# Patient Record
Sex: Male | Born: 1995 | Race: Black or African American | Hispanic: No | Marital: Single | Smoking: Never smoker
Health system: Southern US, Community
[De-identification: ages and names within clinical notes are randomized; demographics above are authoritative.]

## PROBLEM LIST (undated history)

## (undated) DIAGNOSIS — R634 Abnormal weight loss: Secondary | ICD-10-CM

## (undated) DIAGNOSIS — R630 Anorexia: Secondary | ICD-10-CM

## (undated) DIAGNOSIS — F840 Autistic disorder: Secondary | ICD-10-CM

## (undated) DIAGNOSIS — R109 Unspecified abdominal pain: Secondary | ICD-10-CM

## (undated) HISTORY — PX: CIRCUMCISION: SUR203

## (undated) HISTORY — DX: Anorexia: R63.0

## (undated) HISTORY — DX: Unspecified abdominal pain: R10.9

## (undated) HISTORY — DX: Abnormal weight loss: R63.4

---
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
## 2019-03-15 ENCOUNTER — Other Ambulatory Visit: Payer: Self-pay | Admitting: Nurse Practitioner

## 2019-03-15 DIAGNOSIS — R569 Unspecified convulsions: Secondary | ICD-10-CM

## 2019-03-18 ENCOUNTER — Ambulatory Visit
Admission: RE | Admit: 2019-03-18 | Discharge: 2019-03-18 | Disposition: A | Discharge status: Home / Self Care | Payer: Medicaid Other | Source: Ambulatory Visit | Attending: Nurse Practitioner | Admitting: Nurse Practitioner

## 2019-03-18 DIAGNOSIS — R569 Unspecified convulsions: Secondary | ICD-10-CM

## 2019-10-29 IMAGING — CT CT HEAD W/O CM
1 series · 16 of 30 positions shown, 20 images · non-contrast
Comparison: December 05, 2014

CLINICAL DATA: Autism with seizure

EXAM:
CT HEAD WITHOUT CONTRAST
TECHNIQUE: Contiguous axial images were obtained from the base of the skull
through the vertex without intravenous contrast.

[Series 2: head w/(date) · axial · 0.49mm/px · z∈[-182,-22]mm · 16 of 36 slices shown, 20 images]
[im 2/36  brain]
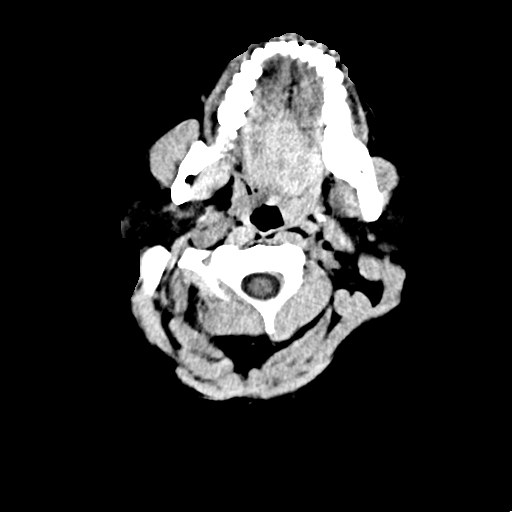
[im 2/36  bone]
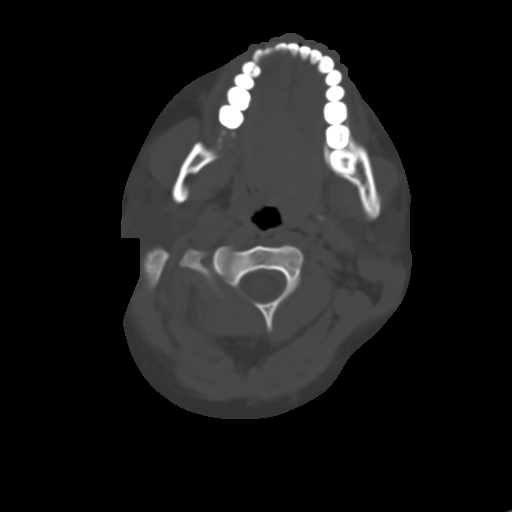
[im 4/36  brain]
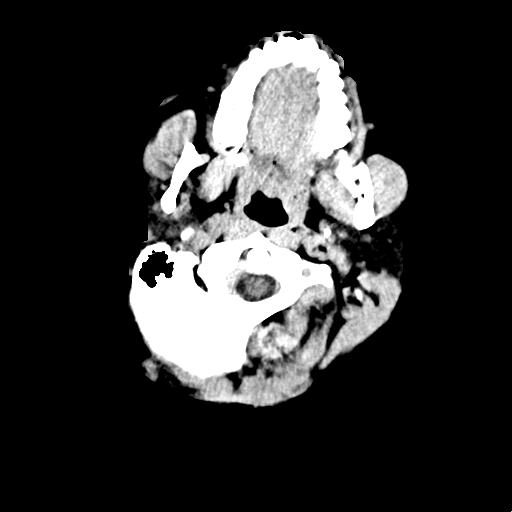
[im 7/36  brain]
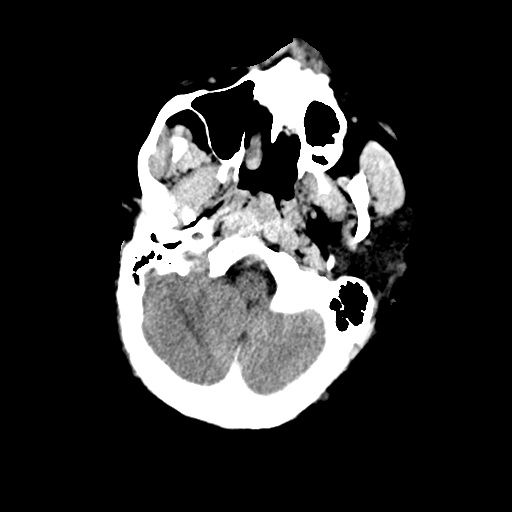
[im 9/36  brain]
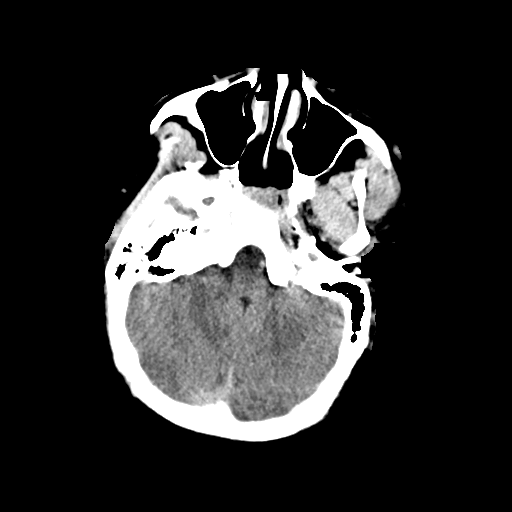
[im 10/36  brain]
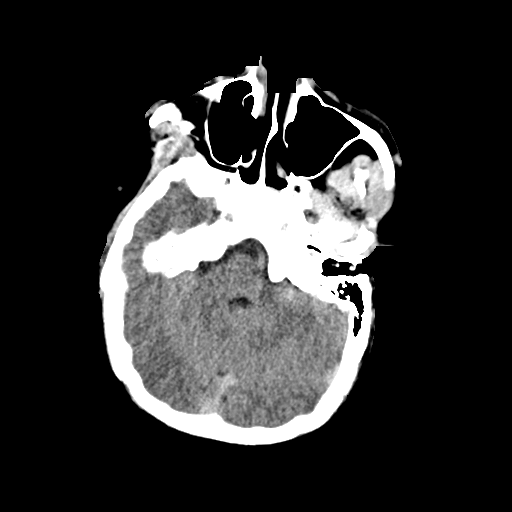
[im 10/36  bone]
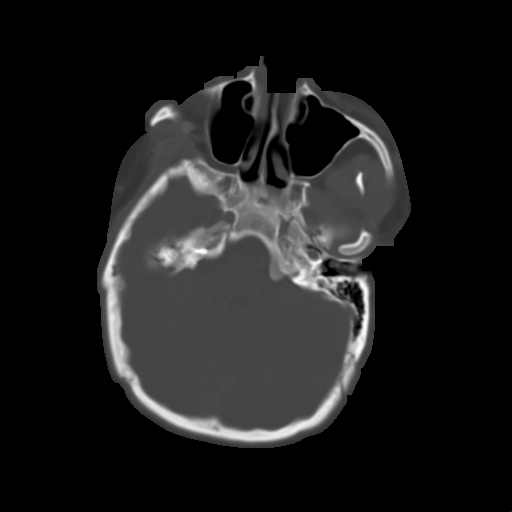
[im 13/36  brain]
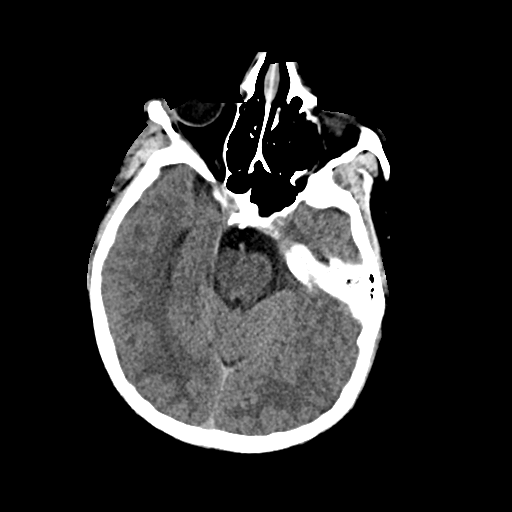
[im 15/36  brain]
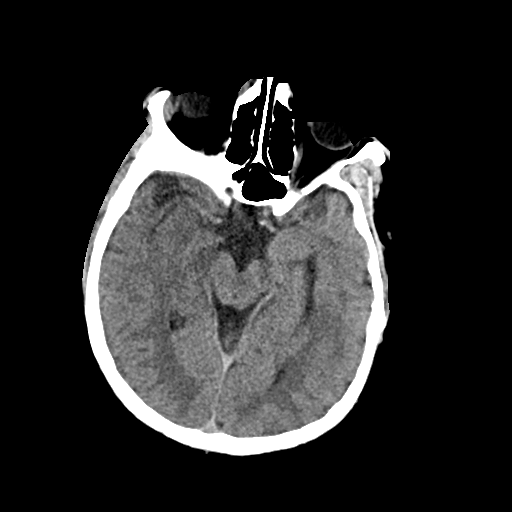
[im 17/36  brain]
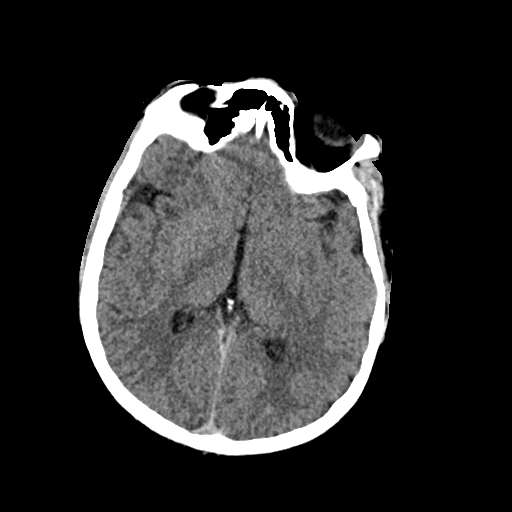
[im 19/36  brain]
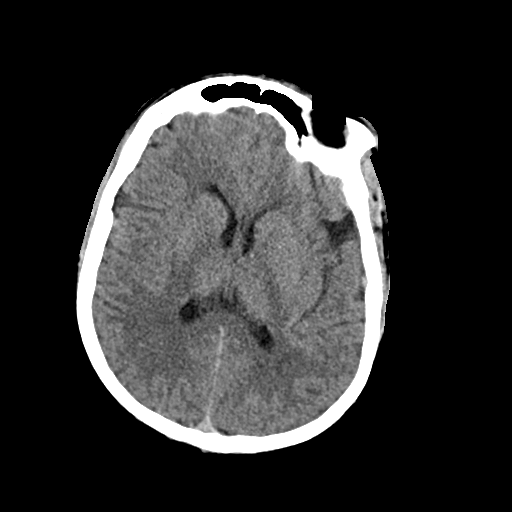
[im 19/36  bone]
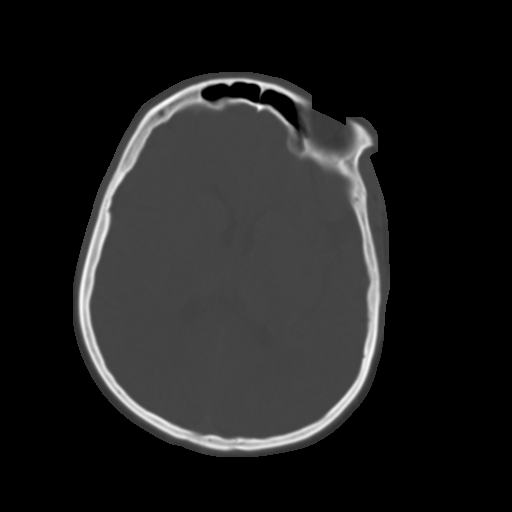
[im 21/36  brain]
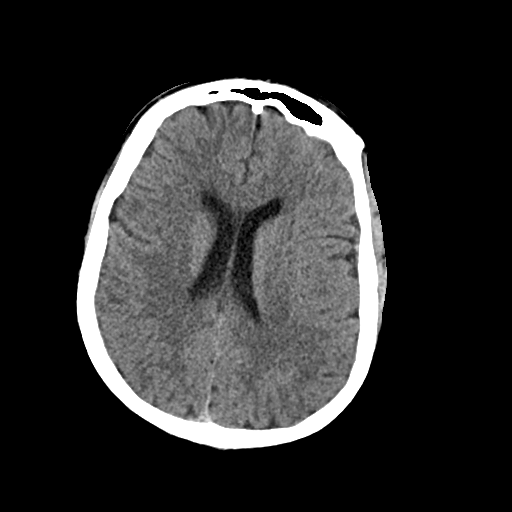
[im 23/36  brain]
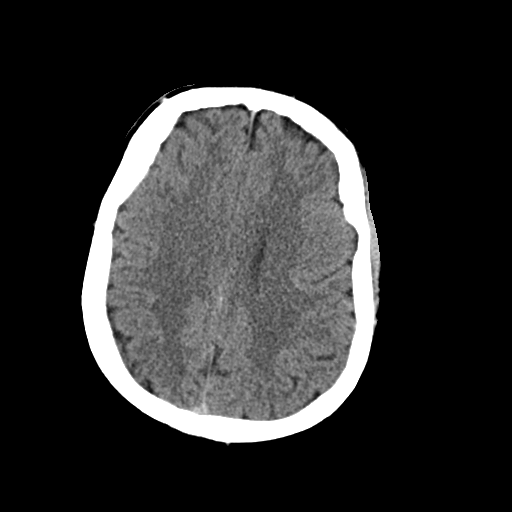
[im 26/36  brain]
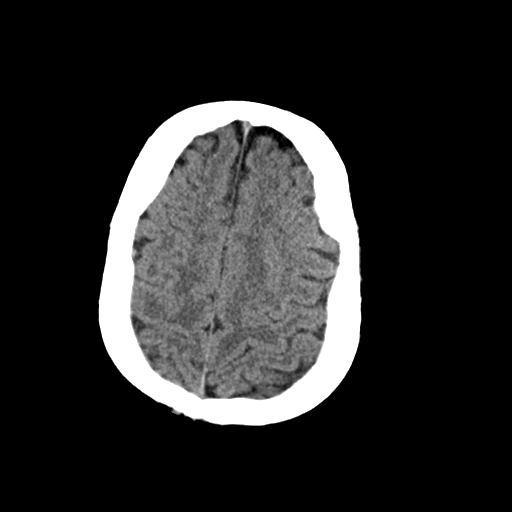
[im 27/36  brain]
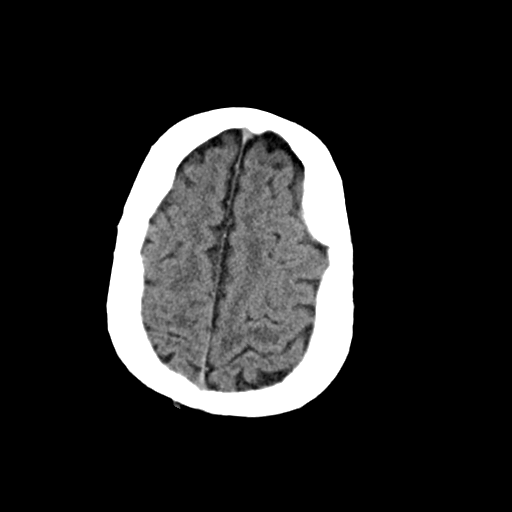
[im 27/36  bone]
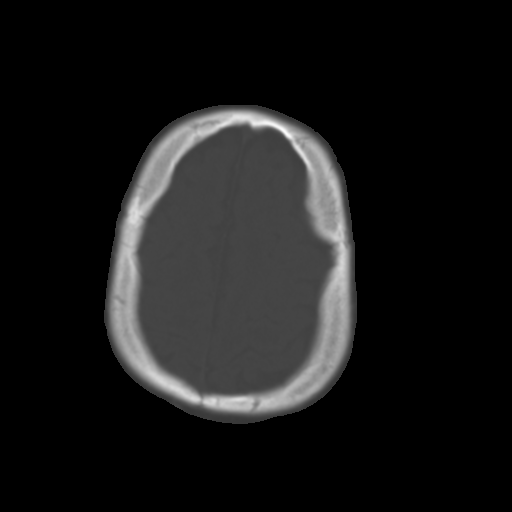
[im 29/36  brain]
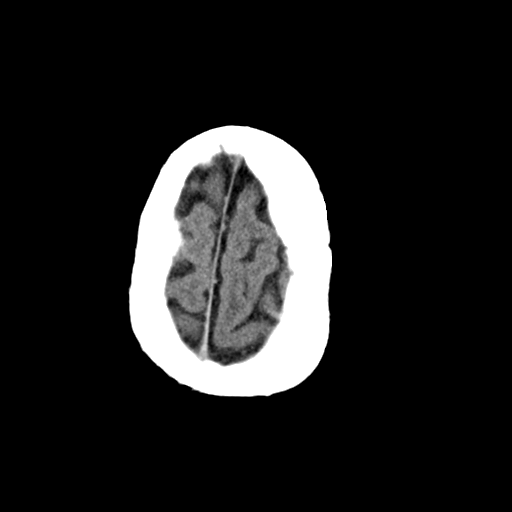
[im 32/36  brain]
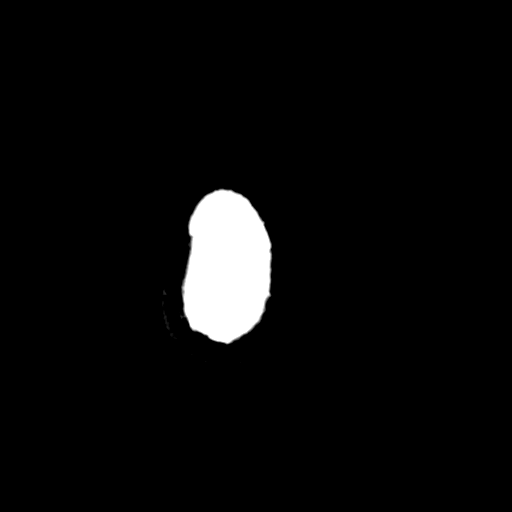
[im 34/36  brain]
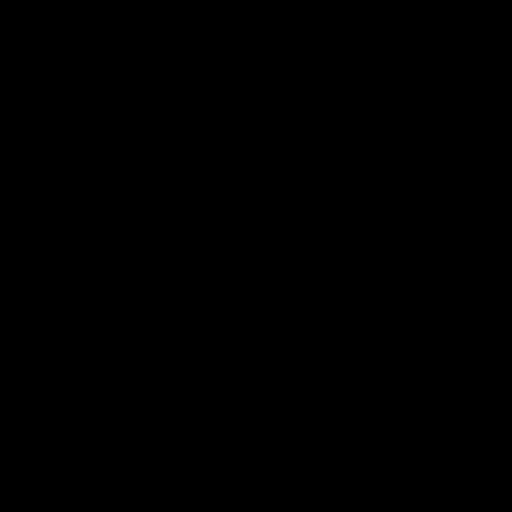

[16 of 30 positions shown; findings below may reference images not displayed]

FINDINGS: Brain: The ventricles are normal in size and configuration. There is
no intracranial mass, hemorrhage, extra-axial fluid collection, or
midline shift. The brain parenchyma appears unremarkable. There is
no demonstrable acute infarct.

Vascular: There is no hyperdense vessel. There is no evident
vascular calcification.

Skull: The bony calvarium appears intact.

Sinuses/Orbits: There is mucosal thickening in several ethmoid air
cells. There is opacification throughout portions of the right
sphenoid sinus. Orbits appear symmetric bilaterally.

Other: Mastoid air cells are clear. There is debris in each external
auditory canal.
IMPRESSION: Areas of paranasal sinus disease. Probable cerumen in each external
auditory canal. Brain parenchyma appears unremarkable. No mass or
hemorrhage.

## 2019-11-08 ENCOUNTER — Other Ambulatory Visit: Payer: Self-pay

## 2019-11-08 ENCOUNTER — Emergency Department (HOSPITAL_COMMUNITY)
Admission: EM | Admit: 2019-11-08 | Discharge: 2019-11-08 | Disposition: A | Discharge status: Home / Self Care | Payer: Medicaid Other | Attending: Emergency Medicine | Admitting: Emergency Medicine

## 2019-11-08 ENCOUNTER — Encounter (HOSPITAL_COMMUNITY): Payer: Self-pay | Admitting: Emergency Medicine

## 2019-11-08 DIAGNOSIS — Z79899 Other long term (current) drug therapy: Secondary | ICD-10-CM | POA: Insufficient documentation

## 2019-11-08 DIAGNOSIS — R569 Unspecified convulsions: Secondary | ICD-10-CM | POA: Insufficient documentation

## 2019-11-08 DIAGNOSIS — F840 Autistic disorder: Secondary | ICD-10-CM | POA: Insufficient documentation

## 2019-11-08 LAB — COMPREHENSIVE METABOLIC PANEL
ALT: 36 U/L (ref 0–44)
AST: 36 U/L (ref 15–41)
Albumin: 4.4 g/dL (ref 3.5–5.0)
Alkaline Phosphatase: 103 U/L (ref 38–126)
Anion gap: 11 (ref 5–15)
BUN: 13 mg/dL (ref 6–20)
CO2: 24 mmol/L (ref 22–32)
Calcium: 9.7 mg/dL (ref 8.9–10.3)
Chloride: 105 mmol/L (ref 98–111)
Creatinine, Ser: 0.94 mg/dL (ref 0.61–1.24)
GFR calc Af Amer: >60 mL/min (ref >60)
GFR calc non Af Amer: >60 mL/min (ref >60)
Glucose, Bld: 100 mg/dL — ABNORMAL HIGH (ref 70–99)
Potassium: 4.4 mmol/L (ref 3.5–5.1)
Sodium: 140 mmol/L (ref 135–145)
Total Bilirubin: 0.5 mg/dL (ref 0.3–1.2)
Total Protein: 7.7 g/dL (ref 6.5–8.1)

## 2019-11-08 LAB — RAPID URINE DRUG SCREEN, HOSP PERFORMED
Amphetamines: NOT DETECTED
Barbiturates: NOT DETECTED
Benzodiazepines: NOT DETECTED
Cocaine: NOT DETECTED
Opiates: NOT DETECTED
Tetrahydrocannabinol: NOT DETECTED

## 2019-11-08 LAB — CBC WITH DIFFERENTIAL/PLATELET
Abs Immature Granulocytes: 0.05 10*3/uL (ref 0.00–0.07)
Basophils Absolute: 0 10*3/uL (ref 0.0–0.1)
Basophils Relative: 0 %
Eosinophils Absolute: 0 10*3/uL (ref 0.0–0.5)
Eosinophils Relative: 0 %
HCT: 49.3 % (ref 39.0–52.0)
Hemoglobin: 16.4 g/dL (ref 13.0–17.0)
Immature Granulocytes: 0 %
Lymphocytes Relative: 11 %
Lymphs Abs: 1.5 10*3/uL (ref 0.7–4.0)
MCH: 26.8 pg (ref 26.0–34.0)
MCHC: 33.3 g/dL (ref 30.0–36.0)
MCV: 80.6 fL (ref 80.0–100.0)
Monocytes Absolute: 0.9 10*3/uL (ref 0.1–1.0)
Monocytes Relative: 6 %
Neutro Abs: 11.4 10*3/uL — ABNORMAL HIGH (ref 1.7–7.7)
Neutrophils Relative %: 83 %
Platelets: 173 10*3/uL (ref 150–400)
RBC: 6.12 MIL/uL — ABNORMAL HIGH (ref 4.22–5.81)
RDW: 13.7 % (ref 11.5–15.5)
WBC: 13.9 10*3/uL — ABNORMAL HIGH (ref 4.0–10.5)
nRBC: 0 % (ref 0.0–0.2)

## 2019-11-08 LAB — CBG MONITORING, ED: Glucose-Capillary: 100 mg/dL — ABNORMAL HIGH (ref 70–99)

## 2019-11-08 LAB — MAGNESIUM: Magnesium: 2.2 mg/dL (ref 1.7–2.4)

## 2019-11-08 LAB — PHOSPHORUS: Phosphorus: 3 mg/dL (ref 2.5–4.6)

## 2019-11-08 NOTE — ED Provider Notes (Signed)
[facility] EMERGENCY DEPARTMENT Provider Note   CSN: [id number] Arrival date & time: 11/08/19  1512     History No chief complaint on file.   Wayne Wilson is a 24 y.o. male with PMH ASD and seizure-like activity who presents to the ED via EMS for seizure-like activity.  I obtained history from EMS reports that he was found at school to be lying on his back shaking with his arms in a flexed, fixed position, sounding like decorticate posturing.  He was then postictal for approximately 10 minutes before returning to baseline.  Patient is accompanied by his father who reports that patient has been well and without any recent illness or complaints.  He states that his last seizure was on 03/08/2019. 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  Patient has severe ASD/intellectual disability and is a poor historian.  However, he is able to deny any current discomfort, tongue biting, incontinence, weakness or numbness, or other symptoms.   Level 5 caveat due to intellectual disability.  HPI     Past Medical History:  Diagnosis Date  . Abdominal pain   . Autism   . Loss of appetite   . Weight loss     Patient Active Problem List   Diagnosis Date Noted  . Seizure-like activity (HCC) 12/09/2014  . Autism spectrum disorder 12/09/2014  . Abdominal pain   . Loss of appetite   . Weight loss     Past Surgical History:  Procedure Laterality Date  . CIRCUMCISION    . ESOPHAGOGASTRODUODENOSCOPY  10-12-13   Performed at [facility], Biopsy was done as well       Family History  Problem Relation Age of Onset  . Heart attack Maternal Grandmother   . Diabetes Maternal Grandmother   . Seizures Maternal Grandmother   . Heart attack Paternal Grandmother     Social History   Tobacco Use  . Smoking status: Never Smoker  . Smokeless tobacco: Never Used  Substance Use Topics    . Alcohol use: No  . Drug use: No    Home Medications Prior to Admission medications   Medication Sig Start Date End Date Taking? Authorizing Provider  Ascorbic Acid (VITAMIN C) 250 MG CHEW Chew 250 mg by mouth 2 (two) times daily. 11/05/13   [provider]  LORazepam (ATIVAN) 0.5 MG tablet Take by mouth 2 (two) times daily. TAKE 1 TABLET BY MOUTH AT 8 AM AND TAKE 1.75 - 2 TABLETS BY MOUTH AT 8 PM. 11/28/14   [provider]  OLANZapine (ZYPREXA) 5 MG tablet Take 2.5 mg by mouth at bedtime. 09/26/14   [provider]  Pediatric Multiple Vit-C-FA (PEDIATRIC MULTIVITAMIN) chewable tablet Chew 2 tablets by mouth daily.    [provider]  Pediatric Multiple Vit-Vit C (POLY-VI-SOL PO) Take 1 tablet by mouth daily. 11/05/13   [provider]  polyethylene glycol (MIRALAX / GLYCOLAX) packet Take 17 g by mouth daily as needed.     [provider]    Allergies    Patient has no known allergies.  Review of Systems   Review of Systems  Unable to perform ROS: Other    Physical Exam Updated Vital Signs BP (!) 120/102   Pulse 93   Temp 98.4 F (36.9 C) (Oral)   Resp 16   Ht 5\' 1"  (1.549 m)   Wt 61 kg  SpO2 97%   BMI 25.41 kg/m   Physical Exam Vitals and nursing note reviewed. Exam conducted with a chaperone present.  Constitutional:      General: He is not in acute distress.    Appearance: Normal appearance. He is not ill-appearing.  HENT:     Head: Normocephalic and atraumatic.     Mouth/Throat:     Comments: No evidence of tongue biting. Eyes:     General: No scleral icterus.    Extraocular Movements: Extraocular movements intact.     Conjunctiva/sclera: Conjunctivae normal.     Pupils: Pupils are equal, round, and reactive to light.     Comments: No nystagmus.  Cardiovascular:     Rate and Rhythm: Normal rate and regular rhythm.     Pulses: Normal pulses.     Heart sounds: Normal heart sounds.  Pulmonary:     Effort:  Pulmonary effort is normal. No respiratory distress.     Breath sounds: Normal breath sounds.  Musculoskeletal:     Cervical back: Normal range of motion and neck supple. No rigidity.  Skin:    General: Skin is dry.     Capillary Refill: Capillary refill takes less than 2 seconds.  Neurological:     Mental Status: He is alert and oriented to person, place, and time.     GCS: GCS eye subscore is 4. GCS verbal subscore is 5. GCS motor subscore is 6.     Comments: CN II to XII grossly intact.  Ambulates without ataxia or other difficulty.  ROM and strength intact throughout.  Sensation intact throughout.  Psychiatric:        Mood and Affect: Mood normal.        Thought Content: Thought content normal.     ED Results / Procedures / Treatments   Labs (all labs ordered are listed, but only abnormal results are displayed) Labs Reviewed  CBC WITH DIFFERENTIAL/PLATELET - Abnormal; Notable for the following components:      Result Value   WBC 13.9 (*)    RBC 6.12 (*)    Neutro Abs 11.4 (*)    All other components within normal limits  COMPREHENSIVE METABOLIC PANEL - Abnormal; Notable for the following components:   Glucose, Bld 100 (*)    All other components within normal limits  CBG MONITORING, ED - Abnormal; Notable for the following components:   Glucose-Capillary 100 (*)    All other components within normal limits  RAPID URINE DRUG SCREEN, HOSP PERFORMED  MAGNESIUM  PHOSPHORUS    EKG EKG Interpretation  Date/Time:  Monday Nov 08 2019 18:02:59 EDT Ventricular Rate:  97 PR Interval:    QRS Duration: 78 QT Interval:  324 QTC Calculation: 412 R Axis:   53 Text Interpretation: Sinus rhythm Confirmed by Tilden Fossa [phone]) on 11/08/2019 7:14:11 PM   Radiology No results found.  Procedures Procedures (including critical care time)  Medications Ordered in ED Medications - No data to display  ED Course  I have reviewed the triage vital signs and the nursing  notes.  Pertinent labs & imaging results that were available during my care of the patient were reviewed by me and considered in my medical decision making (see chart for details).    MDM Rules/Calculators/A&P                      Patient's history is concerning for seizure activity.  This was observed and his teacher lowered him to the ground.  Lasted for approximately 45 seconds.  There was a 10-minute duration of postictal confusion, consistent with seizure.  However, he has had multiple negative EEGs in the past.  There was no incontinence or tongue biting.  During my examination, patient was back at baseline and requesting to eat/leave.  I reviewed patient's medical record and he was seen by Dr. Sabra Heck, his neurologist, had EEG performed 03/16/2019 which demonstrated no clear evidence of any focal or diffuse abnormalities.  No epileptiform activity noted.  There is also a head CT obtained without contrast 03/19/2019 which demonstrated paranasal sinus disease, however brain parenchyma was unremarkable and without evidence of mass or hemorrhage.    Obtained basic laboratory work-up which demonstrated no concerning findings.  Patient has a mild leukocytosis, however consistent with his baseline.  Mother states that he had elevated white count and some of his recent labs with primary care provider, as well.  On reexamination, patient continues to be back at baseline.  Mother feels reassured by work-up today.  Plan is for him to follow-up with his neurologist for ongoing evaluation and management.  Do not feel as though further imaging or work-up is warranted.  Discussed with Dr. Ralene Bathe who agrees with assessment and plan.  Patient provided strict instructions to avoid driving , bathing, and swimming until being cleared by neurology.  Patient voiced understanding and is agreeable to those precautions.    Final Clinical Impression(s) / ED Diagnoses Final diagnoses:  Seizure-like activity [facility])    Rx  / [place] Orders ED Discharge Orders    None       Corena Herter, PA-C 11/08/19 1944    Hayden Rasmussen, MD 11/08/19 2028

## 2019-11-08 NOTE — ED Triage Notes (Signed)
Pt presents from school with seizure -school staff reports tonic-clonic "postured inward" X1 min. -autistic at baseline -Dad at bedside; says last seizure was last year -Per EMS, PT was post-ictal for 10 minutes

## 2019-11-08 NOTE — Discharge Instructions (Addendum)
Please follow-up with Royston Sinner, MD, your neurologist, for ongoing evaluation and management.  I suggest that you call to get an appointment for evaluation as soon as possible.  Otherwise, your laboratory work-up here today was reassuring.  Please continue take your home medications, as directed.  Return to the ED or seek immediate medical attention should you experience any new or worsening symptoms.
